# Patient Record
Sex: Female | Born: 1980 | Race: Asian | Hispanic: No
Health system: Southern US, Community
[De-identification: ages and names within clinical notes are randomized; demographics above are authoritative.]

---

## 2015-07-22 ENCOUNTER — Other Ambulatory Visit: Payer: Self-pay | Admitting: Family Medicine

## 2015-07-22 DIAGNOSIS — N6001 Solitary cyst of right breast: Secondary | ICD-10-CM

## 2015-08-02 ENCOUNTER — Ambulatory Visit
Admission: RE | Admit: 2015-08-02 | Discharge: 2015-08-02 | Disposition: A | Discharge status: Home / Self Care | Payer: BLUE CROSS/BLUE SHIELD | Source: Ambulatory Visit | Attending: Family Medicine | Admitting: Family Medicine

## 2015-08-02 DIAGNOSIS — N6001 Solitary cyst of right breast: Secondary | ICD-10-CM

## 2015-12-05 ENCOUNTER — Ambulatory Visit: Payer: BLUE CROSS/BLUE SHIELD | Admitting: Obstetrics and Gynecology

## 2015-12-05 VITALS — BP 110/71 | HR 68 | Wt 121.0 lb

## 2015-12-05 DIAGNOSIS — Z0000 Encounter for general adult medical examination without abnormal findings: Secondary | ICD-10-CM

## 2015-12-05 NOTE — Progress Notes (Addendum)
Patient prefers female provider. Unable to add her on to anyone today. Will reschedule patient for her annual. Patient left records from [facility] practice at home and will bring with nv.   Durene Romans MD Attending Center for [employer])

## 2016-01-01 ENCOUNTER — Encounter: Payer: BLUE CROSS/BLUE SHIELD | Admitting: Obstetrics & Gynecology

## 2016-01-09 ENCOUNTER — Other Ambulatory Visit: Payer: Self-pay | Admitting: Family Medicine

## 2016-01-09 DIAGNOSIS — D241 Benign neoplasm of right breast: Secondary | ICD-10-CM

## 2016-02-07 ENCOUNTER — Ambulatory Visit
Admission: RE | Admit: 2016-02-07 | Discharge: 2016-02-07 | Disposition: A | Discharge status: Home / Self Care | Payer: BLUE CROSS/BLUE SHIELD | Source: Ambulatory Visit | Attending: Family Medicine | Admitting: Family Medicine

## 2016-02-07 DIAGNOSIS — D241 Benign neoplasm of right breast: Secondary | ICD-10-CM

## 2016-08-20 ENCOUNTER — Other Ambulatory Visit: Payer: Self-pay | Admitting: Specialist

## 2016-08-20 DIAGNOSIS — N6310 Unspecified lump in the right breast, unspecified quadrant: Secondary | ICD-10-CM

## 2016-08-27 ENCOUNTER — Other Ambulatory Visit: Payer: Self-pay | Admitting: Family Medicine

## 2016-08-27 DIAGNOSIS — N6310 Unspecified lump in the right breast, unspecified quadrant: Secondary | ICD-10-CM

## 2016-08-28 ENCOUNTER — Ambulatory Visit
Admission: RE | Admit: 2016-08-28 | Discharge: 2016-08-28 | Disposition: A | Discharge status: Home / Self Care | Payer: BLUE CROSS/BLUE SHIELD | Source: Ambulatory Visit | Attending: Specialist | Admitting: Specialist

## 2016-08-28 DIAGNOSIS — N6310 Unspecified lump in the right breast, unspecified quadrant: Secondary | ICD-10-CM

## 2017-09-09 ENCOUNTER — Other Ambulatory Visit: Payer: Self-pay | Admitting: Family Medicine

## 2017-09-09 DIAGNOSIS — N6310 Unspecified lump in the right breast, unspecified quadrant: Secondary | ICD-10-CM

## 2017-09-16 ENCOUNTER — Ambulatory Visit
Admission: RE | Admit: 2017-09-16 | Discharge: 2017-09-16 | Disposition: A | Discharge status: Home / Self Care | Payer: BLUE CROSS/BLUE SHIELD | Source: Ambulatory Visit | Attending: Family Medicine | Admitting: Family Medicine

## 2017-09-16 ENCOUNTER — Other Ambulatory Visit: Payer: Self-pay | Admitting: Family Medicine

## 2017-09-16 DIAGNOSIS — N6310 Unspecified lump in the right breast, unspecified quadrant: Secondary | ICD-10-CM

## 2019-09-07 ENCOUNTER — Ambulatory Visit: Payer: Self-pay | Attending: Internal Medicine

## 2019-09-07 DIAGNOSIS — Z23 Encounter for immunization: Secondary | ICD-10-CM

## 2019-09-07 NOTE — Progress Notes (Signed)
   Covid-19 Vaccination Clinic  Name:  Karen Cortez    MRN: [id number] DOB: Nov 14, 1980  09/07/2019  Ms. Desilva was observed post Covid-19 immunization for 15 minutes without incident. She was provided with Vaccine Information Sheet and instruction to access the V-Safe system.   Ms. Helson was instructed to call 911 with any severe reactions post vaccine: Marland Kitchen Difficulty breathing  . Swelling of face and throat  . A fast heartbeat  . A bad rash all over body  . Dizziness and weakness   Immunizations Administered    Name Date Dose VIS Date Route   Pfizer COVID-19 Vaccine 09/07/2019  1:30 PM 0.3 mL 05/26/2019 Intramuscular   Manufacturer: [place]   Lot: [id number]   [place]: [id number]

## 2019-10-02 ENCOUNTER — Ambulatory Visit: Payer: Self-pay | Attending: Internal Medicine

## 2019-10-02 DIAGNOSIS — Z23 Encounter for immunization: Secondary | ICD-10-CM

## 2019-10-02 NOTE — Progress Notes (Signed)
   Covid-19 Vaccination Clinic  Name:  Karen Cortez    MRN: [id number] DOB: 05-11-1981  10/02/2019  Karen Cortez was observed post Covid-19 immunization for 15 minutes without incident. She was provided with Vaccine Information Sheet and instruction to access the V-Safe system.   Karen Cortez was instructed to call 911 with any severe reactions post vaccine: Marland Kitchen Difficulty breathing  . Swelling of face and throat  . A fast heartbeat  . A bad rash all over body  . Dizziness and weakness   Immunizations Administered    Name Date Dose VIS Date Route   Pfizer COVID-19 Vaccine 10/02/2019  8:27 AM 0.3 mL 08/09/2018 Intramuscular   Manufacturer: [place]   Lot: [id number]   [place]: [id number]

## 2020-02-26 ENCOUNTER — Other Ambulatory Visit: Payer: Self-pay | Admitting: Obstetrics & Gynecology

## 2020-02-26 DIAGNOSIS — Z1231 Encounter for screening mammogram for malignant neoplasm of breast: Secondary | ICD-10-CM

## 2020-04-08 ENCOUNTER — Ambulatory Visit: Payer: Self-pay

## 2020-04-11 ENCOUNTER — Ambulatory Visit
Admission: RE | Admit: 2020-04-11 | Discharge: 2020-04-11 | Disposition: A | Discharge status: Home / Self Care | Payer: BC Managed Care – PPO | Source: Ambulatory Visit | Attending: Obstetrics & Gynecology | Admitting: Obstetrics & Gynecology

## 2020-04-11 ENCOUNTER — Other Ambulatory Visit: Payer: Self-pay

## 2020-04-11 DIAGNOSIS — Z1231 Encounter for screening mammogram for malignant neoplasm of breast: Secondary | ICD-10-CM

## 2020-04-12 ENCOUNTER — Other Ambulatory Visit: Payer: Self-pay | Admitting: Obstetrics & Gynecology

## 2020-04-12 DIAGNOSIS — R928 Other abnormal and inconclusive findings on diagnostic imaging of breast: Secondary | ICD-10-CM

## 2020-04-24 ENCOUNTER — Ambulatory Visit
Admission: RE | Admit: 2020-04-24 | Discharge: 2020-04-24 | Disposition: A | Discharge status: Home / Self Care | Payer: BC Managed Care – PPO | Source: Ambulatory Visit | Attending: Obstetrics & Gynecology | Admitting: Obstetrics & Gynecology

## 2020-04-24 ENCOUNTER — Other Ambulatory Visit: Payer: Self-pay

## 2020-04-24 ENCOUNTER — Other Ambulatory Visit: Payer: Self-pay | Admitting: Obstetrics & Gynecology

## 2020-04-24 DIAGNOSIS — R928 Other abnormal and inconclusive findings on diagnostic imaging of breast: Secondary | ICD-10-CM

## 2020-04-24 DIAGNOSIS — R921 Mammographic calcification found on diagnostic imaging of breast: Secondary | ICD-10-CM

## 2020-04-30 ENCOUNTER — Other Ambulatory Visit: Payer: Self-pay

## 2020-04-30 ENCOUNTER — Ambulatory Visit
Admission: RE | Admit: 2020-04-30 | Discharge: 2020-04-30 | Disposition: A | Discharge status: Home / Self Care | Payer: BC Managed Care – PPO | Source: Ambulatory Visit | Attending: Obstetrics & Gynecology | Admitting: Obstetrics & Gynecology

## 2020-04-30 DIAGNOSIS — R921 Mammographic calcification found on diagnostic imaging of breast: Secondary | ICD-10-CM

## 2021-03-20 ENCOUNTER — Other Ambulatory Visit: Payer: Self-pay | Admitting: Family Medicine

## 2021-03-20 ENCOUNTER — Other Ambulatory Visit: Payer: Self-pay | Admitting: Physician Assistant

## 2021-03-20 DIAGNOSIS — Z1231 Encounter for screening mammogram for malignant neoplasm of breast: Secondary | ICD-10-CM

## 2021-04-23 ENCOUNTER — Ambulatory Visit
Admission: RE | Admit: 2021-04-23 | Discharge: 2021-04-23 | Disposition: A | Discharge status: Home / Self Care | Payer: BC Managed Care – PPO | Source: Ambulatory Visit | Attending: Family Medicine | Admitting: Family Medicine

## 2021-04-23 ENCOUNTER — Other Ambulatory Visit: Payer: Self-pay

## 2021-04-23 DIAGNOSIS — Z1231 Encounter for screening mammogram for malignant neoplasm of breast: Secondary | ICD-10-CM

## 2022-03-11 ENCOUNTER — Other Ambulatory Visit: Payer: Self-pay | Admitting: Family Medicine

## 2022-03-11 DIAGNOSIS — Z1231 Encounter for screening mammogram for malignant neoplasm of breast: Secondary | ICD-10-CM

## 2022-04-24 ENCOUNTER — Ambulatory Visit
Admission: RE | Admit: 2022-04-24 | Discharge: 2022-04-24 | Disposition: A | Discharge status: Home / Self Care | Payer: BC Managed Care – PPO | Source: Ambulatory Visit | Attending: Family Medicine | Admitting: Family Medicine

## 2022-04-24 DIAGNOSIS — Z1231 Encounter for screening mammogram for malignant neoplasm of breast: Secondary | ICD-10-CM

## 2022-07-31 IMAGING — MG MM DIGITAL SCREENING BILAT W/ TOMO AND CAD
8 series · 9 of 24 positions shown · non-contrast
Comparison: Previous exam(s).

CLINICAL DATA: Screening.

EXAM:
DIGITAL SCREENING BILATERAL MAMMOGRAM WITH TOMOSYNTHESIS AND CAD
TECHNIQUE: Bilateral screening digital craniocaudal and mediolateral oblique
mammograms were obtained. Bilateral screening digital breast
tomosynthesis was performed. The images were evaluated with
computer-aided detection.

[L CC synth-2D]
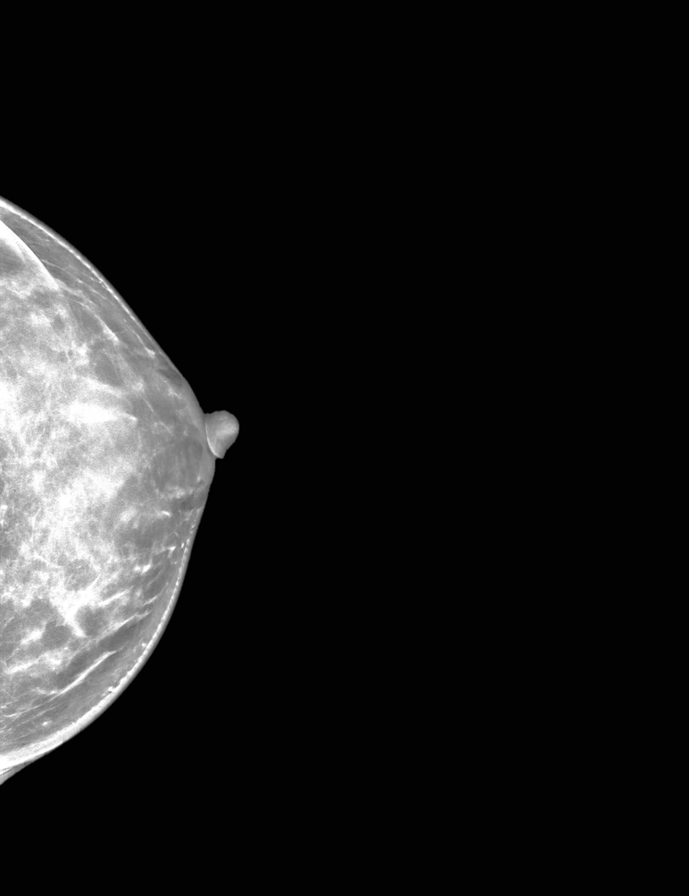

[R CC synth-2D]
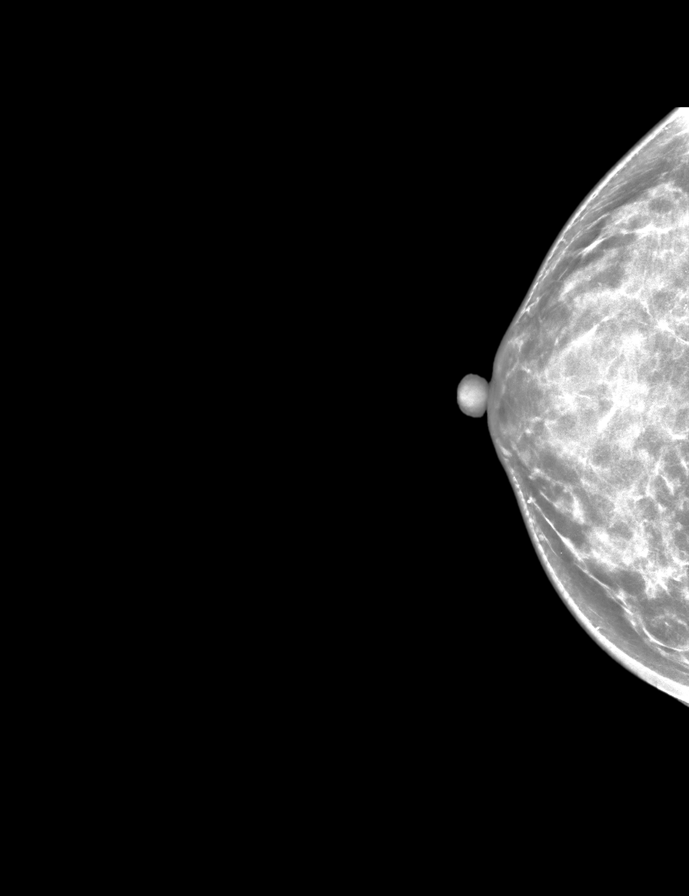

[L MLO synth-2D]
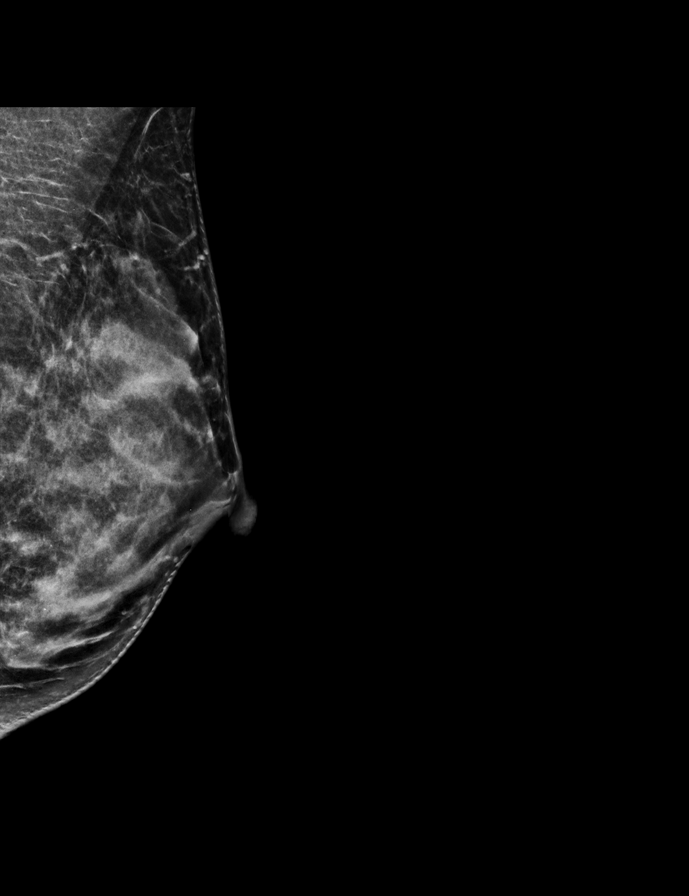

[R MLO synth-2D]
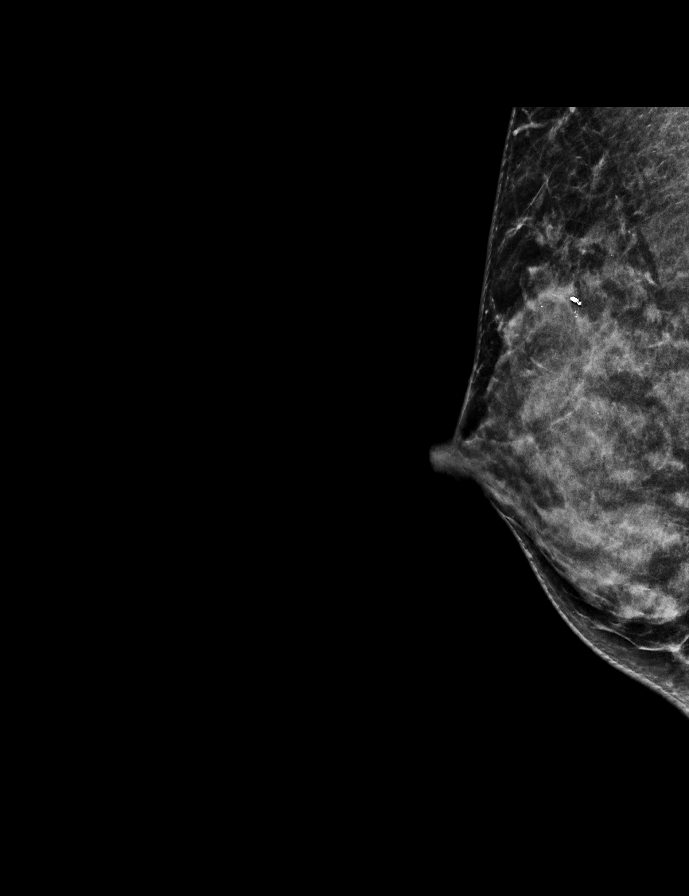

[L MLO tomo · 2 of 54 frames shown]
[frame 18/54]
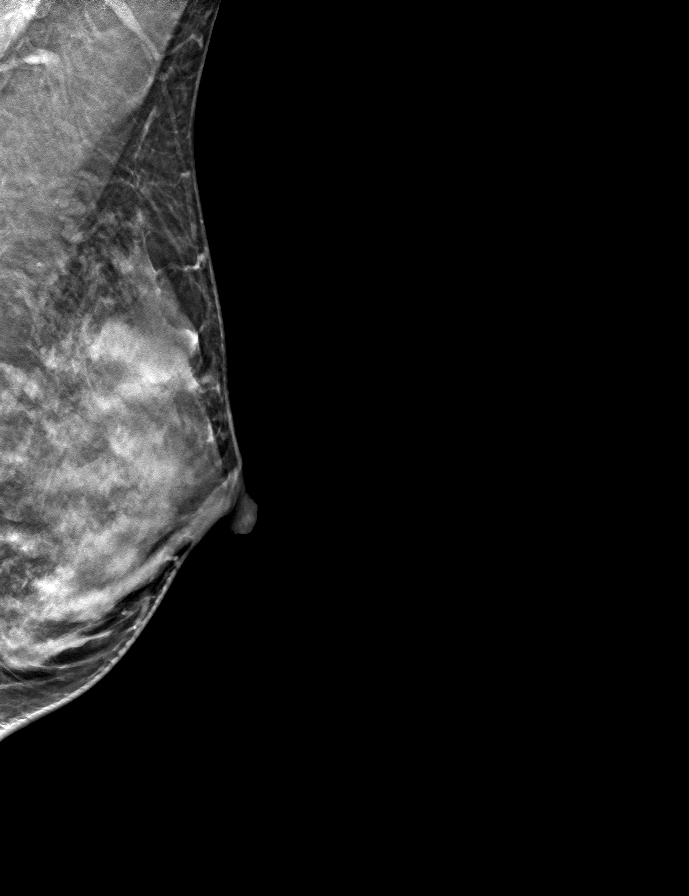
[frame 27/54]
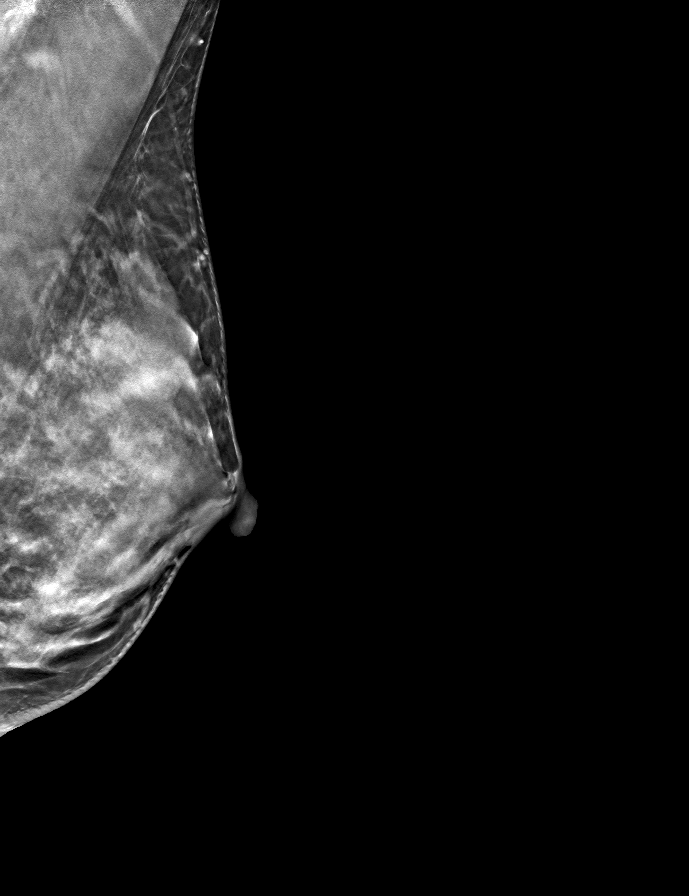

[R MLO tomo · tomo slice 25/49.0]
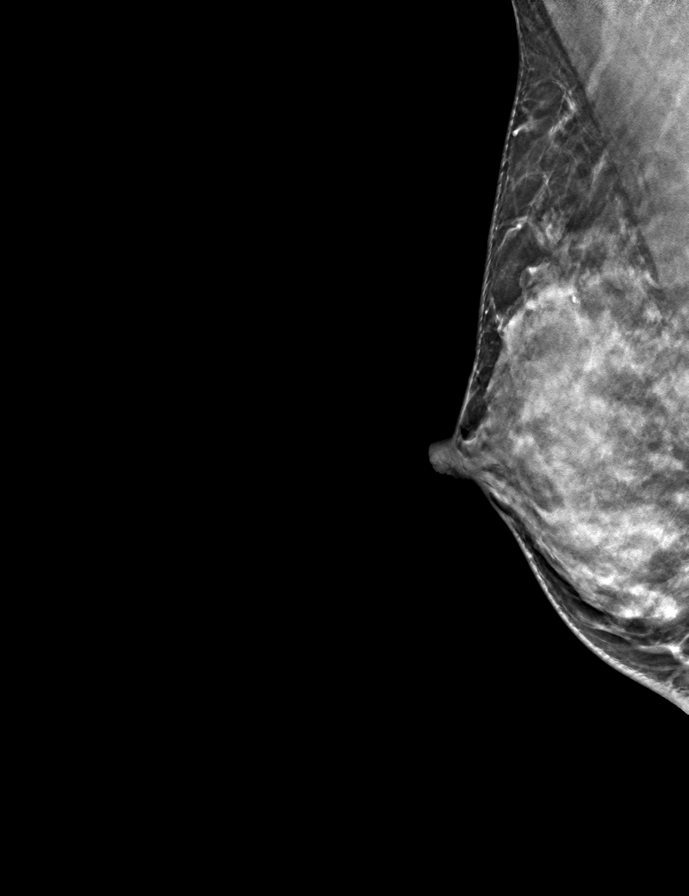

[L CC tomo · tomo slice 35/68.0]
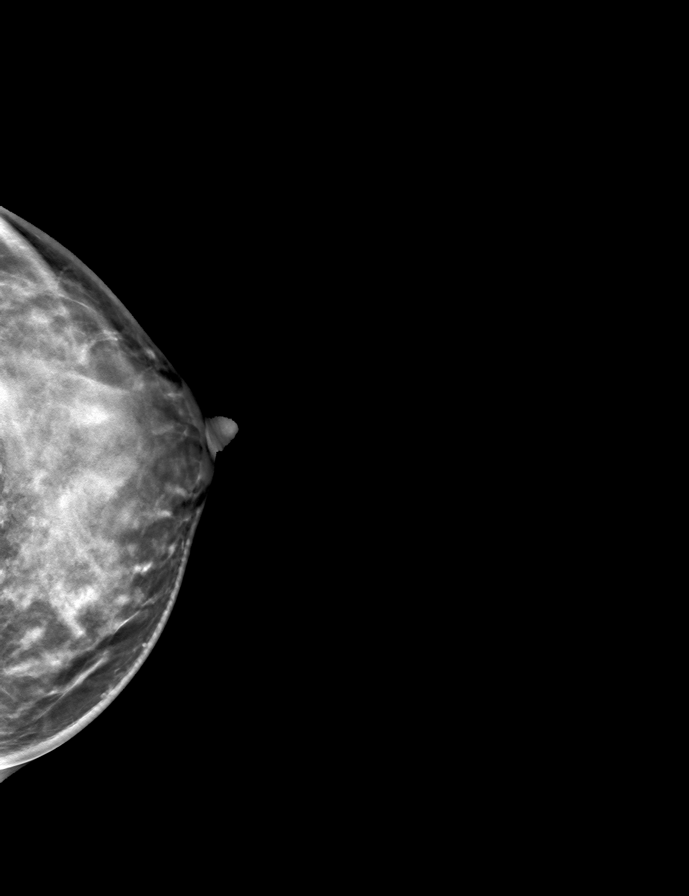

[R CC tomo · tomo slice 30/59.0]
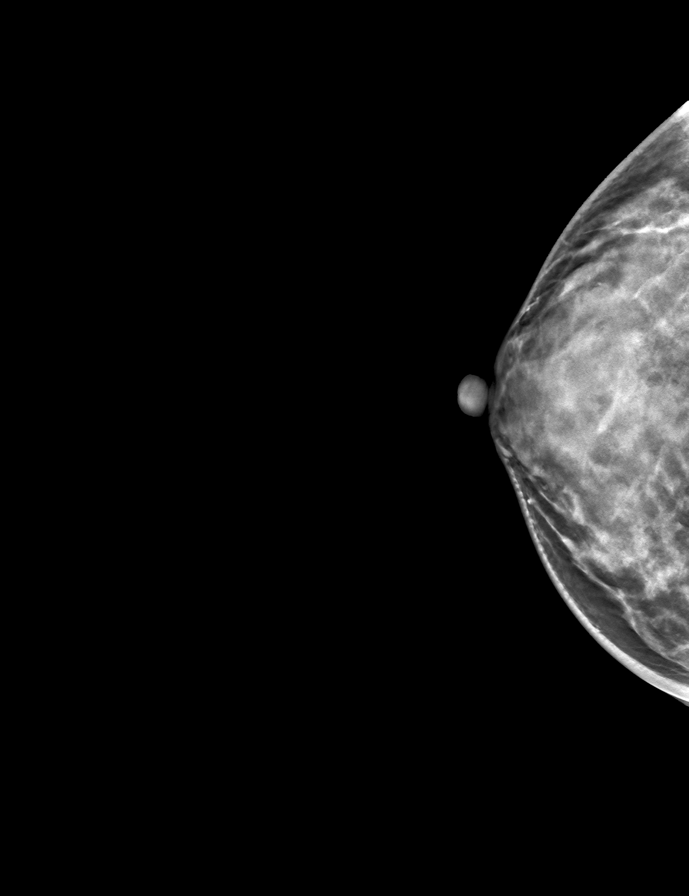

[9 of 24 positions shown; findings below may reference images not displayed]

ACR Breast Density Category d: The breast tissue is extremely dense,
which lowers the sensitivity of mammography
FINDINGS: There are no findings suspicious for malignancy.
IMPRESSION: No mammographic evidence of malignancy. A result letter of this
screening mammogram will be mailed directly to the patient.

RECOMMENDATION:
Screening mammogram in one year. (Code:[id number])

BI-RADS CATEGORY  1: Negative.

## 2022-12-04 ENCOUNTER — Ambulatory Visit: Payer: BC Managed Care – PPO

## 2022-12-09 ENCOUNTER — Ambulatory Visit: Payer: BC Managed Care – PPO
